# Patient Record
Sex: Female | Born: 1960 | Race: White | Hispanic: No | Marital: Married
Health system: Southern US, Community
[De-identification: ages and names within clinical notes are randomized; demographics above are authoritative.]

---

## 2017-02-27 ENCOUNTER — Other Ambulatory Visit: Payer: Self-pay | Admitting: Nurse Practitioner

## 2017-02-27 DIAGNOSIS — M5020 Other cervical disc displacement, unspecified cervical region: Secondary | ICD-10-CM

## 2017-03-15 ENCOUNTER — Other Ambulatory Visit: Payer: Self-pay

## 2017-04-04 ENCOUNTER — Other Ambulatory Visit: Payer: Self-pay

## 2017-04-06 ENCOUNTER — Ambulatory Visit
Admission: RE | Admit: 2017-04-06 | Discharge: 2017-04-06 | Disposition: A | Discharge status: Home / Self Care | Payer: BLUE CROSS/BLUE SHIELD | Source: Ambulatory Visit | Attending: Nurse Practitioner | Admitting: Nurse Practitioner

## 2017-04-06 ENCOUNTER — Encounter: Payer: Self-pay | Admitting: Radiology

## 2017-04-06 DIAGNOSIS — M5020 Other cervical disc displacement, unspecified cervical region: Secondary | ICD-10-CM

## 2017-04-06 MED ORDER — IOPAMIDOL (ISOVUE-M 300) INJECTION 61%
1.0000 mL | Freq: Once | INTRAMUSCULAR | Status: AC | PRN
  Administered 2017-04-06: 1 mL via EPIDURAL

## 2017-04-06 MED ORDER — TRIAMCINOLONE ACETONIDE 40 MG/ML IJ SUSP (RADIOLOGY)
60.0000 mg | Freq: Once | INTRAMUSCULAR | Status: AC
  Administered 2017-04-06: 60 mg via EPIDURAL

## 2017-04-06 NOTE — Discharge Instructions (Signed)

## 2018-04-15 IMAGING — XA DG INJECT/[PERSON_NAME] INC NEEDLE/CATH/PLC EPI/CERV/THOR W/IMG
1 series · 1 of 1 positions shown · non-contrast
Comparison: none

CLINICAL DATA: Cervical spondylosis without myelopathy. LEFT arm
pain.

[Series 1: ortho standard · 1 of 1 slices shown]
[im 1/1]
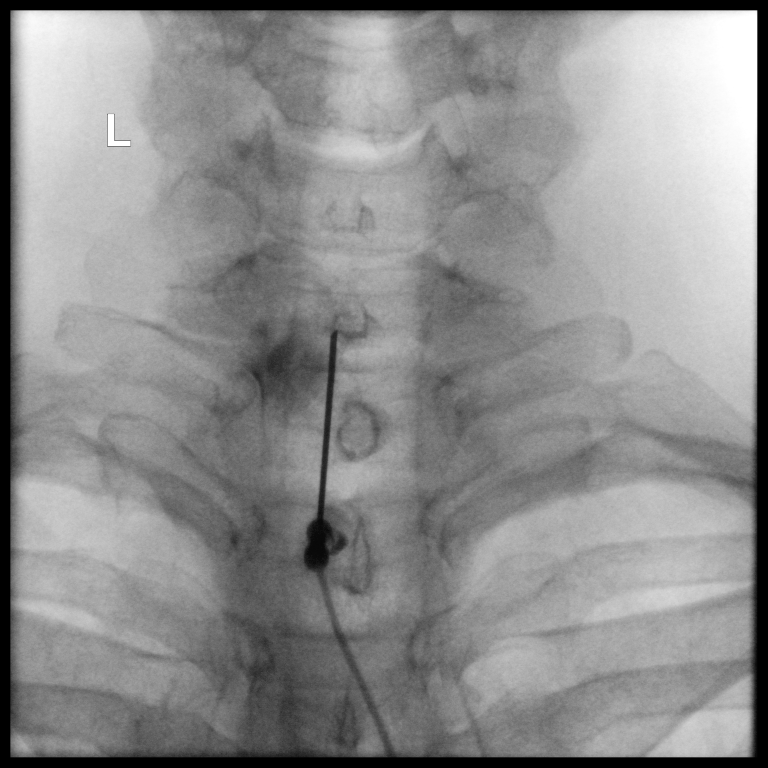

[1 of 1 positions shown; findings below may reference images not displayed]

FLUOROSCOPY TIME:  8 seconds corresponding to a Dose Area Product of
7.22 ?Gy*m2

PROCEDURE:
Informed written consent was obtained.  Time-out was performed.

An appropriate skin entry site was chosen, cleansed with Betadine,
and anesthetized with 1% lidocaine.

CERVICAL EPIDURAL INJECTION

An interlaminar approach was performed on the LEFT at C7-T1 . A 20
gauge epidural needle was advanced using loss-of-resistance
technique.

DIAGNOSTIC EPIDURAL INJECTION

Injection of Isovue-M 300 shows a good epidural pattern with spread
above and below the level of needle placement, primarily on the
LEFT. No vascular opacification is seen. THERAPEUTIC

EPIDURAL INJECTION

1.5 ml of Kenalog 40 mixed with 1 ml of 1% Lidocaine and 2 ml of
normal saline were then instilled. The procedure was well-tolerated,
and the patient was discharged thirty minutes following the
injection in good condition.
IMPRESSION: Technically successful first epidural injection on the LEFT at
C7-T1.
